# Patient Record
Sex: Female | Born: 1995 | Race: White | Hispanic: No | Marital: Single | Smoking: Never smoker
Health system: Southern US, Community
[De-identification: ages and names within clinical notes are randomized; demographics above are authoritative.]

## PROBLEM LIST (undated history)

## (undated) DIAGNOSIS — L709 Acne, unspecified: Secondary | ICD-10-CM

## (undated) HISTORY — DX: Acne, unspecified: L70.9

---
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
## 2015-04-09 ENCOUNTER — Ambulatory Visit: Payer: Self-pay | Admitting: Family Medicine

## 2015-04-13 ENCOUNTER — Encounter: Payer: Self-pay | Admitting: Family Medicine

## 2015-04-13 ENCOUNTER — Ambulatory Visit (INDEPENDENT_AMBULATORY_CARE_PROVIDER_SITE_OTHER): Payer: Self-pay | Admitting: Family Medicine

## 2015-04-13 VITALS — BP 111/63 | HR 52 | Temp 98.1°F

## 2015-04-13 DIAGNOSIS — L700 Acne vulgaris: Secondary | ICD-10-CM

## 2015-04-13 DIAGNOSIS — Z3009 Encounter for other general counseling and advice on contraception: Secondary | ICD-10-CM

## 2015-04-13 NOTE — Patient Instructions (Signed)
Contraception Choices Contraception (birth control) is the use of any methods or devices to prevent pregnancy. Below are some methods to help avoid pregnancy. HORMONAL METHODS   Contraceptive implant. This is a thin, plastic tube containing progesterone hormone. It does not contain estrogen hormone. Your health care provider inserts the tube in the inner part of the upper arm. The tube can remain in place for up to 3 years. After 3 years, the implant must be removed. The implant prevents the ovaries from releasing an egg (ovulation), thickens the cervical mucus to prevent sperm from entering the uterus, and thins the lining of the inside of the uterus.  Progesterone-only injections. These injections are given every 3 months by your health care provider to prevent pregnancy. This synthetic progesterone hormone stops the ovaries from releasing eggs. It also thickens cervical mucus and changes the uterine lining. This makes it harder for sperm to survive in the uterus.  Birth control pills. These pills contain estrogen and progesterone hormone. They work by preventing the ovaries from releasing eggs (ovulation). They also cause the cervical mucus to thicken, preventing the sperm from entering the uterus. Birth control pills are prescribed by a health care provider.Birth control pills can also be used to treat heavy periods.  Minipill. This type of birth control pill contains only the progesterone hormone. They are taken every day of each month and must be prescribed by your health care provider.  Birth control patch. The patch contains hormones similar to those in birth control pills. It must be changed once a week and is prescribed by a health care provider.  Vaginal ring. The ring contains hormones similar to those in birth control pills. It is left in the vagina for 3 weeks, removed for 1 week, and then a new one is put back in place. The patient must be comfortable inserting and removing the ring  from the vagina.A health care provider's prescription is necessary.  Emergency contraception. Emergency contraceptives prevent pregnancy after unprotected sexual intercourse. This pill can be taken right after sex or up to 5 days after unprotected sex. It is most effective the sooner you take the pills after having sexual intercourse. Most emergency contraceptive pills are available without a prescription. Check with your pharmacist. Do not use emergency contraception as your only form of birth control. BARRIER METHODS   Female condom. This is a thin sheath (latex or rubber) that is worn over the penis during sexual intercourse. It can be used with spermicide to increase effectiveness.  Female condom. This is a soft, loose-fitting sheath that is put into the vagina before sexual intercourse.  Diaphragm. This is a soft, latex, dome-shaped barrier that must be fitted by a health care provider. It is inserted into the vagina, along with a spermicidal jelly. It is inserted before intercourse. The diaphragm should be left in the vagina for 6 to 8 hours after intercourse.  Cervical cap. This is a round, soft, latex or plastic cup that fits over the cervix and must be fitted by a health care provider. The cap can be left in place for up to 48 hours after intercourse.  Sponge. This is a soft, circular piece of polyurethane foam. The sponge has spermicide in it. It is inserted into the vagina after wetting it and before sexual intercourse.  Spermicides. These are chemicals that kill or block sperm from entering the cervix and uterus. They come in the form of creams, jellies, suppositories, foam, or tablets. They do not require a   prescription. They are inserted into the vagina with an applicator before having sexual intercourse. The process must be repeated every time you have sexual intercourse. INTRAUTERINE CONTRACEPTION  Intrauterine device (IUD). This is a T-shaped device that is put in a woman's uterus  during a menstrual period to prevent pregnancy. There are 2 types:  Copper IUD. This type of IUD is wrapped in copper wire and is placed inside the uterus. Copper makes the uterus and fallopian tubes produce a fluid that kills sperm. It can stay in place for 10 years.  Hormone IUD. This type of IUD contains the hormone progestin (synthetic progesterone). The hormone thickens the cervical mucus and prevents sperm from entering the uterus, and it also thins the uterine lining to prevent implantation of a fertilized egg. The hormone can weaken or kill the sperm that get into the uterus. It can stay in place for 3-5 years, depending on which type of IUD is used. PERMANENT METHODS OF CONTRACEPTION  Female tubal ligation. This is when the woman's fallopian tubes are surgically sealed, tied, or blocked to prevent the egg from traveling to the uterus.  Hysteroscopic sterilization. This involves placing a small coil or insert into each fallopian tube. Your doctor uses a technique called hysteroscopy to do the procedure. The device causes scar tissue to form. This results in permanent blockage of the fallopian tubes, so the sperm cannot fertilize the egg. It takes about 3 months after the procedure for the tubes to become blocked. You must use another form of birth control for these 3 months.  Female sterilization. This is when the female has the tubes that carry sperm tied off (vasectomy).This blocks sperm from entering the vagina during sexual intercourse. After the procedure, the man can still ejaculate fluid (semen). NATURAL PLANNING METHODS  Natural family planning. This is not having sexual intercourse or using a barrier method (condom, diaphragm, cervical cap) on days the woman could become pregnant.  Calendar method. This is keeping track of the length of each menstrual cycle and identifying when you are fertile.  Ovulation method. This is avoiding sexual intercourse during ovulation.  Symptothermal  method. This is avoiding sexual intercourse during ovulation, using a thermometer and ovulation symptoms.  Post-ovulation method. This is timing sexual intercourse after you have ovulated. Regardless of which type or method of contraception you choose, it is important that you use condoms to protect against the transmission of sexually transmitted infections (STIs). Talk with your health care provider about which form of contraception is most appropriate for you. Document Released: 11/03/2005 Document Revised: 11/08/2013 Document Reviewed: 04/28/2013 ExitCare Patient Information 2015 [employer]. This information is not intended to replace advice given to you by your health care provider. Make sure you discuss any questions you have with your health care provider.  

## 2015-04-13 NOTE — Progress Notes (Signed)
Pre visit review using our clinic review tool, if applicable. No additional management support is needed unless otherwise documented below in the visit note. 

## 2015-04-13 NOTE — Progress Notes (Signed)
Patient ID: Patricia Espinoza, female    DOB: 01-23-1996  Age: 19 y.o. MRN: [id number]    Subjective:  Subjective HPI Patricia Espinoza presents to establish.  She has no complaints.    Review of Systems  Constitutional: Negative for activity change, appetite change, fatigue and unexpected weight change.  Respiratory: Negative for cough and shortness of breath.   Cardiovascular: Negative for chest pain and palpitations.  Psychiatric/Behavioral: Negative for behavioral problems and dysphoric mood. The patient is not nervous/anxious.     History Past Medical History  Diagnosis Date  . Acne     She has past surgical history that includes Knee surgery (Left, 2012); Knee surgery (Right, 2013); and Wisdom tooth extraction (2015).   Her family history includes Breast cancer in an other family member; Diabetes in her paternal grandfather; Hyperlipidemia in her father and another family member; Hypertension in her father and another family member.She reports that she has never smoked. She has never used smokeless tobacco. She reports that she does not drink alcohol or use illicit drugs.  No current outpatient prescriptions on file prior to visit.   No current facility-administered medications on file prior to visit.     Objective:  Objective Physical Exam  Constitutional: She is oriented to person, place, and time. She appears well-developed and well-nourished.  HENT:  Head: Normocephalic and atraumatic.  Eyes: Conjunctivae and EOM are normal.  Neck: Normal range of motion. Neck supple. No JVD present. Carotid bruit is not present. No thyromegaly present.  Cardiovascular: Normal rate, regular rhythm and normal heart sounds.   No murmur heard. Pulmonary/Chest: Effort normal and breath sounds normal. No respiratory distress. She has no wheezes. She has no rales. She exhibits no tenderness.  Musculoskeletal: She exhibits no edema.  Neurological: She is alert and oriented to person, place,  and time.  Psychiatric: She has a normal mood and affect. Her behavior is normal.   BP 111/63 mmHg  Pulse 52  Temp(Src) 98.1 F (36.7 C) (Oral)  SpO2 100%  LMP 03/14/2015 Wt Readings from Last 3 Encounters:  06/10/11 120 lb (54.432 kg) (58 %*, Z = 0.21)   * Growth percentiles are based on CDC 2-20 Years data.     No results found for: WBC, HGB, HCT, PLT, GLUCOSE, CHOL, TRIG, HDL, LDLDIRECT, LDLCALC, ALT, AST, NA, K, CL, CREATININE, BUN, CO2, TSH, PSA, INR, GLUF, HGBA1C, MICROALBUR  Mr Extrem Low Jt*r* W/o Cm  04/17/2010   Clinical Data: Medial right knee pain and limited range of motion since a fall on 04/14/2010   MRI OF THE RIGHT KNEE WITHOUT CONTRAST   Technique:  Multiplanar, multisequence MR imaging of the right knee was performed.  No intravenous contrast was administered.   Comparison: Radiographs dated 04/15/2010   Findings:  There is a contusion of the lateral aspect of the lateral femoral condyle deep to the lateral patellofemoral ligament with slight edema in the overlying subcutaneous soft tissues consistent with soft tissue contusion.   There is no other evidence to suggest a transient patellar dislocation. The distal quadriceps tendon and patellar tendon are normal.  The medial patellofemoral ligament is normal.   There is a small joint effusion.   The cruciate and collateral ligaments are normal.  Medial and lateral menisci are normal.   IMPRESSION:   1.  Focal bone contusion of the lateral aspect of the lateral femoral condyle deep to the lateral patellofemoral ligament with overlying soft tissue contusion.  I suspect that this is due  to a direct blow to that area. 2.  Small joint effusion.  Provider: Dorthy Cooler    Assessment & Plan:  Plan I am having Ms. Stribling maintain her SPRINTEC 28, EPIPEN 2-PAK, clindamycin, ampicillin, and adapalene.  Meds ordered this encounter  Medications  . SPRINTEC 28 0.25-35 MG-MCG tablet    Sig: Take 1 tablet by mouth daily.    Refill:  1    . EPIPEN 2-PAK 0.3 MG/0.3ML SOAJ injection    Sig: as directed.    Refill:  0  . clindamycin (CLINDAGEL) 1 % gel    Sig: Apply to the face in the morning    Refill:  11  . ampicillin (PRINCIPEN) 500 MG capsule    Sig: Take 500 mg by mouth 2 (two) times daily.    Refill:  11  . adapalene (DIFFERIN) 0.1 % cream    Sig: Apply a pea-size amount to the face at Bedtime    Refill:  11    Problem List Items Addressed This Visit    None    Visit Diagnoses    Encounter for other general counseling or advice on contraception    -  Primary    Acne vulgaris        Relevant Medications    SPRINTEC 28 0.25-35 MG-MCG tablet    clindamycin (CLINDAGEL) 1 % gel    adapalene (DIFFERIN) 0.1 % cream       Follow-up: Return in about 1 year (around 04/12/2016), or if symptoms worsen or fail to improve, for annual exam.  Loreen Freud, DO

## 2015-04-16 DIAGNOSIS — L700 Acne vulgaris: Secondary | ICD-10-CM | POA: Insufficient documentation

## 2015-12-31 ENCOUNTER — Other Ambulatory Visit: Payer: Self-pay

## 2015-12-31 MED ORDER — SPRINTEC 28 0.25-35 MG-MCG PO TABS: 1.0000 | ORAL_TABLET | Freq: Every day | ORAL | Status: DC

## 2016-02-18 DIAGNOSIS — H1031 Unspecified acute conjunctivitis, right eye: Secondary | ICD-10-CM | POA: Diagnosis not present

## 2016-04-07 DIAGNOSIS — L700 Acne vulgaris: Secondary | ICD-10-CM | POA: Diagnosis not present

## 2016-08-13 ENCOUNTER — Other Ambulatory Visit: Payer: Self-pay | Admitting: Family Medicine

## 2016-09-12 ENCOUNTER — Other Ambulatory Visit: Payer: Self-pay | Admitting: Family Medicine

## 2016-09-15 NOTE — Telephone Encounter (Signed)
30 day supply of birth control medication sent to pharmacy. Pt is past due for physical. Please call pt to arrange appointment with Dr Laury AxonLowne soon. Thanks!

## 2016-09-18 NOTE — Telephone Encounter (Signed)
Spoke with Mom and she will call to schedule when she has patients college schedule with her.

## 2016-10-13 ENCOUNTER — Other Ambulatory Visit: Payer: Self-pay | Admitting: Family Medicine

## 2016-10-14 ENCOUNTER — Telehealth: Payer: Self-pay

## 2016-10-14 NOTE — Telephone Encounter (Signed)
Called pt voicemail for pt to call the ofc. Please call pt to set up for physical exam she is over due thanks.  PC

## 2016-10-20 NOTE — Telephone Encounter (Signed)
Left message on VM @ [phone] for patient to call and schedule appointment

## 2016-10-22 NOTE — Telephone Encounter (Signed)
error 

## 2016-11-11 ENCOUNTER — Other Ambulatory Visit: Payer: Self-pay | Admitting: Family Medicine

## 2016-11-27 ENCOUNTER — Ambulatory Visit (INDEPENDENT_AMBULATORY_CARE_PROVIDER_SITE_OTHER): Payer: BLUE CROSS/BLUE SHIELD | Admitting: Family Medicine

## 2016-11-27 ENCOUNTER — Encounter: Payer: Self-pay | Admitting: Family Medicine

## 2016-11-27 VITALS — BP 116/80 | HR 70 | Temp 98.3°F | Resp 16 | Ht 66.0 in | Wt 138.2 lb

## 2016-11-27 DIAGNOSIS — Z0000 Encounter for general adult medical examination without abnormal findings: Secondary | ICD-10-CM

## 2016-11-27 DIAGNOSIS — Z3009 Encounter for other general counseling and advice on contraception: Secondary | ICD-10-CM

## 2016-11-27 MED ORDER — MONONESSA 0.25-35 MG-MCG PO TABS: 1.0000 | ORAL_TABLET | Freq: Every day | ORAL | 11 refills | Status: DC

## 2016-11-27 NOTE — Patient Instructions (Signed)
Preventive Care 18-39 Years, Female Preventive care refers to lifestyle choices and visits with your health care provider that can promote health and wellness. What does preventive care include?  A yearly physical exam. This is also called an annual well check.  Dental exams once or twice a year.  Routine eye exams. Ask your health care provider how often you should have your eyes checked.  Personal lifestyle choices, including:  Daily care of your teeth and gums.  Regular physical activity.  Eating a healthy diet.  Avoiding tobacco and drug use.  Limiting alcohol use.  Practicing safe sex.  Taking vitamin and mineral supplements as recommended by your health care provider. What happens during an annual well check? The services and screenings done by your health care provider during your annual well check will depend on your age, overall health, lifestyle risk factors, and family history of disease. Counseling  Your health care provider may ask you questions about your:  Alcohol use.  Tobacco use.  Drug use.  Emotional well-being.  Home and relationship well-being.  Sexual activity.  Eating habits.  Work and work environment.  Method of birth control.  Menstrual cycle.  Pregnancy history. Screening  You may have the following tests or measurements:  Height, weight, and BMI.  Diabetes screening. This is done by checking your blood sugar (glucose) after you have not eaten for a while (fasting).  Blood pressure.  Lipid and cholesterol levels. These may be checked every 5 years starting at age 20.  Skin check.  Hepatitis C blood test.  Hepatitis B blood test.  Sexually transmitted disease (STD) testing.  BRCA-related cancer screening. This may be done if you have a family history of breast, ovarian, tubal, or peritoneal cancers.  Pelvic exam and Pap test. This may be done every 3 years starting at age 21. Starting at age 30, this may be done every 5  years if you have a Pap test in combination with an HPV test. Discuss your test results, treatment options, and if necessary, the need for more tests with your health care provider. Vaccines  Your health care provider may recommend certain vaccines, such as:  Influenza vaccine. This is recommended every year.  Tetanus, diphtheria, and acellular pertussis (Tdap, Td) vaccine. You may need a Td booster every 10 years.  Varicella vaccine. You may need this if you have not been vaccinated.  HPV vaccine. If you are 26 or younger, you may need three doses over 6 months.  Measles, mumps, and rubella (MMR) vaccine. You may need at least one dose of MMR. You may also need a second dose.  Pneumococcal 13-valent conjugate (PCV13) vaccine. You may need this if you have certain conditions and were not previously vaccinated.  Pneumococcal polysaccharide (PPSV23) vaccine. You may need one or two doses if you smoke cigarettes or if you have certain conditions.  Meningococcal vaccine. One dose is recommended if you are age 19-21 years and a first-year college student living in a residence hall, or if you have one of several medical conditions. You may also need additional booster doses.  Hepatitis A vaccine. You may need this if you have certain conditions or if you travel or work in places where you may be exposed to hepatitis A.  Hepatitis B vaccine. You may need this if you have certain conditions or if you travel or work in places where you may be exposed to hepatitis B.  Haemophilus influenzae type b (Hib) vaccine. You may need this   if you have certain risk factors. Talk to your health care provider about which screenings and vaccines you need and how often you need them. This information is not intended to replace advice given to you by your health care provider. Make sure you discuss any questions you have with your health care provider. Document Released: 12/30/2001 Document Revised: 07/23/2016  Document Reviewed: 09/04/2015 Elsevier Interactive Patient Education  2017 [employer].

## 2016-11-27 NOTE — Progress Notes (Signed)
Pre visit review using our clinic review tool, if applicable. No additional management support is needed unless otherwise documented below in the visit note. 

## 2016-11-27 NOTE — Progress Notes (Signed)
Subjective:     Patricia Espinoza is a 21 y.o. female and is here for a comprehensive physical exam. The patient reports no problems.  Social History   Social History  . Marital status: Single    Spouse name: N/A  . Number of children: N/A  . Years of education: N/A   Occupational History  . student     Social History Main Topics  . Smoking status: Never Smoker  . Smokeless tobacco: Never Used  . Alcohol use Yes     Comment: rare  . Drug use: No  . Sexual activity: Yes    Partners: Male    Birth control/ protection: Pill, Condom   Other Topics Concern  . Not on file   Social History Narrative  . No narrative on file   Health Maintenance  Topic Date Due  . CHLAMYDIA SCREENING  04/03/2011  . HIV Screening  04/03/2011  . INFLUENZA VACCINE  02/14/2017 (Originally 06/17/2016)  . TETANUS/TDAP  02/01/2024    The following portions of the patient's history were reviewed and updated as appropriate: She  has a past medical history of Acne. She  does not have any pertinent problems on file. She  has a past surgical history that includes Knee surgery (Left, 2012); Knee surgery (Right, 2013); and Wisdom tooth extraction (2015). Her family history includes Diabetes in her paternal grandfather; Hyperlipidemia in her father; Hypertension in her father. She  reports that she has never smoked. She has never used smokeless tobacco. She reports that she drinks alcohol. She reports that she does not use drugs. She has a current medication list which includes the following prescription(s): adapalene, clindamycin, epipen 2-pak, and mononessa. Current Outpatient Prescriptions on File Prior to Visit  Medication Sig Dispense Refill  . adapalene (DIFFERIN) 0.1 % cream Apply a pea-size amount to the face at Bedtime  11  . clindamycin (CLINDAGEL) 1 % gel Apply to the face in the morning  11  . EPIPEN 2-PAK 0.3 MG/0.3ML SOAJ injection as directed.  0   No current facility-administered medications on  file prior to visit.    She is allergic to hydrocodone..  Review of Systems Review of Systems  Constitutional: Negative for activity change, appetite change and fatigue.  HENT: Negative for hearing loss, congestion, tinnitus and ear discharge.  dentist q32m Eyes: Negative for visual disturbance (see optho q1y -- vision corrected to 20/20 with glasses).  Respiratory: Negative for cough, chest tightness and shortness of breath.   Cardiovascular: Negative for chest pain, palpitations and leg swelling.  Gastrointestinal: Negative for abdominal pain, diarrhea, constipation and abdominal distention.  Genitourinary: Negative for urgency, frequency, decreased urine volume and difficulty urinating.  Musculoskeletal: Negative for back pain, arthralgias and gait problem.  Skin: Negative for color change, pallor and rash.  Neurological: Negative for dizziness, light-headedness, numbness and headaches.  Hematological: Negative for adenopathy. Does not bruise/bleed easily.  Psychiatric/Behavioral: Negative for suicidal ideas, confusion, sleep disturbance, self-injury, dysphoric mood, decreased concentration and agitation.       Objective:    BP 116/80 (BP Location: Right Arm, Cuff Size: Normal)   Pulse 70   Temp 98.3 F (36.8 C) (Oral)   Resp 16   Ht 5\' 6"  (1.676 m)   Wt 138 lb 3.2 oz (62.7 kg)   LMP 11/13/2016   SpO2 98%   BMI 22.31 kg/m  General appearance: alert, cooperative, appears stated age and no distress Head: Normocephalic, without obvious abnormality, atraumatic Eyes: conjunctivae/corneas clear. PERRL, EOM's intact.  Fundi benign. Ears: normal TM's and external ear canals both ears Nose: Nares normal. Septum midline. Mucosa normal. No drainage or sinus tenderness. Throat: lips, mucosa, and tongue normal; teeth and gums normal Neck: no adenopathy, no carotid bruit, no JVD, supple, symmetrical, trachea midline and thyroid not enlarged, symmetric, no tenderness/mass/nodules Back:  symmetric, no curvature. ROM normal. No CVA tenderness. Lungs: clear to auscultation bilaterally Breasts: normal appearance, no masses or tenderness Heart: regular rate and rhythm, S1, S2 normal, no murmur, click, rub or gallop Abdomen: soft, non-tender; bowel sounds normal; no masses,  no organomegaly Pelvic: deferred Extremities: extremities normal, atraumatic, no cyanosis or edema Pulses: 2+ and symmetric Skin: Skin color, texture, turgor normal. No rashes or lesions Lymph nodes: Cervical, supraclavicular, and axillary nodes normal. Neurologic: Alert and oriented X 3, normal strength and tone. Normal symmetric reflexes. Normal coordination and gait    Assessment:    Healthy female exam.      Plan:    ghm utd Check labs See After Visit Summary for Counseling Recommendations    1. Preventative health care See above  - Lipid panel - Comprehensive metabolic panel - TSH - POCT Urinalysis Dipstick (Automated)  2. Encounter for other general counseling or advice on contraception   - MONONESSA 0.25-35 MG-MCG tablet; Take 1 tablet by mouth daily.  Dispense: 1 Package; Refill: 11

## 2017-04-20 DIAGNOSIS — L700 Acne vulgaris: Secondary | ICD-10-CM | POA: Diagnosis not present

## 2017-05-05 DIAGNOSIS — L710 Perioral dermatitis: Secondary | ICD-10-CM | POA: Diagnosis not present

## 2017-09-09 ENCOUNTER — Telehealth: Payer: Self-pay | Admitting: Family Medicine

## 2017-09-09 NOTE — Telephone Encounter (Signed)
Mail patient imm records per patient needing it for school.

## 2017-12-09 ENCOUNTER — Other Ambulatory Visit: Payer: Self-pay | Admitting: Family Medicine

## 2017-12-09 DIAGNOSIS — Z3009 Encounter for other general counseling and advice on contraception: Secondary | ICD-10-CM

## 2018-01-07 ENCOUNTER — Other Ambulatory Visit: Payer: Self-pay | Admitting: Family Medicine

## 2018-01-07 DIAGNOSIS — Z3009 Encounter for other general counseling and advice on contraception: Secondary | ICD-10-CM

## 2018-01-11 ENCOUNTER — Telehealth: Payer: Self-pay | Admitting: *Deleted

## 2018-01-11 DIAGNOSIS — Z3009 Encounter for other general counseling and advice on contraception: Secondary | ICD-10-CM

## 2018-01-11 MED ORDER — NORGESTIMATE-ETH ESTRADIOL 0.25-35 MG-MCG PO TABS: 1.0000 | ORAL_TABLET | Freq: Every day | ORAL | 0 refills | Status: DC

## 2018-01-13 ENCOUNTER — Other Ambulatory Visit: Payer: Self-pay

## 2018-01-13 ENCOUNTER — Other Ambulatory Visit: Payer: Self-pay | Admitting: Family Medicine

## 2018-01-13 DIAGNOSIS — Z3009 Encounter for other general counseling and advice on contraception: Secondary | ICD-10-CM

## 2018-01-13 MED ORDER — NORGESTIMATE-ETH ESTRADIOL 0.25-35 MG-MCG PO TABS: 1.0000 | ORAL_TABLET | Freq: Every day | ORAL | 0 refills | Status: DC

## 2018-01-13 NOTE — Telephone Encounter (Signed)
Called [facility] [place] and cancelled the Rx for Norgestimate-ethyinyl estradiol, and sent Rx of same through to [facility] [place], [place], per pt. Request.  Called pt.; left voice message that the requested Rx was sent to the [facility] [place].

## 2018-01-13 NOTE — Telephone Encounter (Signed)
Refill was sent to the wrong pharmacy. Pt has called in and re-requested medication order to be sent to  [id number] - [place] - [address] [facility] [address] [phone] (Phone) [phone] (Fax)

## 2018-02-15 ENCOUNTER — Other Ambulatory Visit: Payer: Self-pay | Admitting: Family Medicine

## 2018-02-15 DIAGNOSIS — Z3009 Encounter for other general counseling and advice on contraception: Secondary | ICD-10-CM

## 2018-02-25 ENCOUNTER — Other Ambulatory Visit: Payer: Self-pay | Admitting: *Deleted

## 2018-02-25 DIAGNOSIS — Z3009 Encounter for other general counseling and advice on contraception: Secondary | ICD-10-CM

## 2018-02-25 MED ORDER — NORGESTIMATE-ETH ESTRADIOL 0.25-35 MG-MCG PO TABS: 1.0000 | ORAL_TABLET | Freq: Every day | ORAL | 1 refills | Status: DC

## 2018-06-04 DIAGNOSIS — L700 Acne vulgaris: Secondary | ICD-10-CM | POA: Diagnosis not present

## 2018-06-04 DIAGNOSIS — L2084 Intrinsic (allergic) eczema: Secondary | ICD-10-CM | POA: Diagnosis not present

## 2018-06-04 DIAGNOSIS — L710 Perioral dermatitis: Secondary | ICD-10-CM | POA: Diagnosis not present

## 2018-06-14 DIAGNOSIS — Z6823 Body mass index (BMI) 23.0-23.9, adult: Secondary | ICD-10-CM | POA: Diagnosis not present

## 2018-06-14 DIAGNOSIS — Z01419 Encounter for gynecological examination (general) (routine) without abnormal findings: Secondary | ICD-10-CM | POA: Diagnosis not present

## 2018-06-25 DIAGNOSIS — L259 Unspecified contact dermatitis, unspecified cause: Secondary | ICD-10-CM | POA: Diagnosis not present

## 2018-07-13 DIAGNOSIS — L259 Unspecified contact dermatitis, unspecified cause: Secondary | ICD-10-CM | POA: Diagnosis not present

## 2018-07-16 DIAGNOSIS — L259 Unspecified contact dermatitis, unspecified cause: Secondary | ICD-10-CM | POA: Diagnosis not present

## 2018-08-11 DIAGNOSIS — Z23 Encounter for immunization: Secondary | ICD-10-CM | POA: Diagnosis not present

## 2019-02-01 DIAGNOSIS — S83001A Unspecified subluxation of right patella, initial encounter: Secondary | ICD-10-CM | POA: Diagnosis not present

## 2019-02-03 DIAGNOSIS — M25561 Pain in right knee: Secondary | ICD-10-CM | POA: Diagnosis not present

## 2019-02-09 ENCOUNTER — Telehealth: Payer: Self-pay

## 2019-02-09 NOTE — Telephone Encounter (Signed)
Please advise 

## 2019-02-09 NOTE — Telephone Encounter (Signed)
Copied from CRM [id number]. Topic: General - Other >> Feb 09, 2019  1:01 PM Percival Spanish wrote:  Pt would like to know if its ok for her to have Meningococcal. 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

## 2019-02-09 NOTE — Telephone Encounter (Signed)
Pt stated that her insurance will cover both vaccines and would like to have them done. Please advise.

## 2019-02-09 NOTE — Telephone Encounter (Signed)
She can have men B  She may want to make sure her ins will pay for meng -- last one 2014 Unless the school is paying for it.---- I'm fine with her having both

## 2019-02-09 NOTE — Telephone Encounter (Signed)
Spoke w/ Pt- she will contact insurance first.

## 2019-02-10 NOTE — Telephone Encounter (Signed)
Please call to set patient up for nurse visit for Bexsero and Menveo.

## 2019-02-15 NOTE — Telephone Encounter (Signed)
Called patient and mother answered. Looking to schedule for 2 vaccines - put on nurse schedule at patients convenience. Patient will call back to schedule

## 2019-02-16 ENCOUNTER — Ambulatory Visit (INDEPENDENT_AMBULATORY_CARE_PROVIDER_SITE_OTHER): Payer: BLUE CROSS/BLUE SHIELD

## 2019-02-16 ENCOUNTER — Other Ambulatory Visit: Payer: Self-pay

## 2019-02-16 DIAGNOSIS — Z23 Encounter for immunization: Secondary | ICD-10-CM | POA: Diagnosis not present

## 2019-02-16 DIAGNOSIS — Z3009 Encounter for other general counseling and advice on contraception: Secondary | ICD-10-CM

## 2019-04-13 ENCOUNTER — Telehealth: Payer: Self-pay | Admitting: *Deleted

## 2019-04-13 ENCOUNTER — Encounter: Payer: Self-pay | Admitting: Family Medicine

## 2019-04-13 ENCOUNTER — Other Ambulatory Visit: Payer: Self-pay

## 2019-04-13 ENCOUNTER — Ambulatory Visit: Payer: BLUE CROSS/BLUE SHIELD | Admitting: Family Medicine

## 2019-04-13 NOTE — Telephone Encounter (Signed)
Did not get message till know will cancel appt.   Copied from CRM [id number]. Topic: General - Inquiry >> Apr 12, 2019  4:46 PM Deborha Payment wrote: Reason for CRM: Patient has doxy.me appt tomorrow 5/27 8AM. Patient would like to cancel appt. >> Apr 13, 2019  9:15 AM Angela Nevin wrote: Patient called stating that she had attempted to cancel appointment but still received call this morning. She inquired if she will be charged for no show appointment.

## 2019-07-13 DIAGNOSIS — Z01419 Encounter for gynecological examination (general) (routine) without abnormal findings: Secondary | ICD-10-CM | POA: Diagnosis not present

## 2019-07-13 DIAGNOSIS — Z6823 Body mass index (BMI) 23.0-23.9, adult: Secondary | ICD-10-CM | POA: Diagnosis not present

## 2020-04-10 ENCOUNTER — Encounter: Payer: BLUE CROSS/BLUE SHIELD | Admitting: Family Medicine

## 2020-04-12 ENCOUNTER — Encounter: Payer: Self-pay | Admitting: Family Medicine

## 2020-04-12 ENCOUNTER — Other Ambulatory Visit: Payer: Self-pay

## 2020-04-12 ENCOUNTER — Other Ambulatory Visit: Payer: Self-pay | Admitting: Medical

## 2020-04-12 ENCOUNTER — Ambulatory Visit (INDEPENDENT_AMBULATORY_CARE_PROVIDER_SITE_OTHER): Payer: BLUE CROSS/BLUE SHIELD | Admitting: Family Medicine

## 2020-04-12 VITALS — BP 110/80 | HR 84 | Temp 97.7°F | Resp 18 | Ht 66.0 in | Wt 140.6 lb

## 2020-04-12 DIAGNOSIS — Z0000 Encounter for general adult medical examination without abnormal findings: Secondary | ICD-10-CM | POA: Diagnosis not present

## 2020-04-12 NOTE — Progress Notes (Signed)
Subjective:     Patricia Espinoza is a 24 y.o. female and is here for a comprehensive physical exam. The patient reports no problems.  Social History   Socioeconomic History  . Marital status: Single    Spouse name: Not on file  . Number of children: Not on file  . Years of education: Not on file  . Highest education level: Not on file  Occupational History  . Occupation: Ship broker   Tobacco Use  . Smoking status: Never Smoker  . Smokeless tobacco: Never Used  Substance and Sexual Activity  . Alcohol use: Yes    Comment: rare  . Drug use: No  . Sexual activity: Yes    Partners: Male    Birth control/protection: Pill, Condom  Other Topics Concern  . Not on file  Social History Narrative  . Not on file   Social Determinants of Health   Financial Resource Strain:   . Difficulty of Paying Living Expenses:   Food Insecurity:   . Worried About Charity fundraiser in the Last Year:   . Arboriculturist in the Last Year:   Transportation Needs:   . Film/video editor (Medical):   Marland Kitchen Lack of Transportation (Non-Medical):   Physical Activity:   . Days of Exercise per Week:   . Minutes of Exercise per Session:   Stress:   . Feeling of Stress :   Social Connections:   . Frequency of Communication with Friends and Family:   . Frequency of Social Gatherings with Friends and Family:   . Attends Religious Services:   . Active Member of Clubs or Organizations:   . Attends Archivist Meetings:   Marland Kitchen Marital Status:   Intimate Partner Violence:   . Fear of Current or Ex-Partner:   . Emotionally Abused:   Marland Kitchen Physically Abused:   . Sexually Abused:    Health Maintenance  Topic Date Due  . CHLAMYDIA SCREENING  Never done  . HIV Screening  Never done  . PAP-Cervical Cytology Screening  Never done  . PAP SMEAR-Modifier  Never done  . INFLUENZA VACCINE  06/17/2020  . TETANUS/TDAP  02/01/2024    The following portions of the patient's history were reviewed and updated as  appropriate:  She  has a past medical history of Acne. She does not have any pertinent problems on file. She  has a past surgical history that includes Knee surgery (Left, 2012); Knee surgery (Right, 2013); and Wisdom tooth extraction (2015). Her family history includes Breast cancer in her unknown relative; Diabetes in her paternal grandfather; Hyperlipidemia in her father and unknown relative; Hypertension in her father and unknown relative. She  reports that she has never smoked. She has never used smokeless tobacco. She reports current alcohol use. She reports that she does not use drugs. She has a current medication list which includes the following prescription(s): adapalene, clindamycin, epipen 2-pak, and norgestimate-ethinyl estradiol. Current Outpatient Medications on File Prior to Visit  Medication Sig Dispense Refill  . adapalene (DIFFERIN) 0.1 % cream Apply a pea-size amount to the face at Bedtime  11  . clindamycin (CLINDAGEL) 1 % gel Apply to the face in the morning  11  . EPIPEN 2-PAK 0.3 MG/0.3ML SOAJ injection as directed.  0  . norgestimate-ethinyl estradiol (ESTARYLLA) 0.25-35 MG-MCG tablet Take 1 tablet by mouth daily. 84 tablet 1   No current facility-administered medications on file prior to visit.   She is allergic to hydrocodone.Marland Kitchen  Review of Systems Review of Systems  Constitutional: Negative for activity change, appetite change and fatigue.  HENT: Negative for hearing loss, congestion, tinnitus and ear discharge.  dentist q92m Eyes: Negative for visual disturbance (see optho q1y -- vision corrected to 20/20 with glasses).  Respiratory: Negative for cough, chest tightness and shortness of breath.   Cardiovascular: Negative for chest pain, palpitations and leg swelling.  Gastrointestinal: Negative for abdominal pain, diarrhea, constipation and abdominal distention.  Genitourinary: Negative for urgency, frequency, decreased urine volume and difficulty urinating.   Musculoskeletal: Negative for back pain, arthralgias and gait problem.  Skin: Negative for color change, pallor and rash.  Neurological: Negative for dizziness, light-headedness, numbness and headaches.  Hematological: Negative for adenopathy. Does not bruise/bleed easily.  Psychiatric/Behavioral: Negative for suicidal ideas, confusion, sleep disturbance, self-injury, dysphoric mood, decreased concentration and agitation.       Objective:    There were no vitals taken for this visit. General appearance: alert, cooperative, appears stated age and no distress Head: Normocephalic, without obvious abnormality, atraumatic Eyes: conjunctivae/corneas clear. PERRL, EOM's intact. Fundi benign. Ears: normal TM's and external ear canals both ears Neck: no adenopathy, no carotid bruit, no JVD, supple, symmetrical, trachea midline and thyroid not enlarged, symmetric, no tenderness/mass/nodules Back: symmetric, no curvature. ROM normal. No CVA tenderness. Lungs: clear to auscultation bilaterally Breasts:gyn  Heart: regular rate and rhythm, S1, S2 normal, no murmur, click, rub or gallop Abdomen: soft, non-tender; bowel sounds normal; no masses,  no organomegaly Pelvic: deferred--gyn Extremities: extremities normal, atraumatic, no cyanosis or edema Pulses: 2+ and symmetric Skin: Skin color, texture, turgor normal. No rashes or lesions Lymph nodes: Cervical, supraclavicular, and axillary nodes normal. Neurologic: Alert and oriented X 3, normal strength and tone. Normal symmetric reflexes. Normal coordination and gait    Assessment:    Healthy female exam.      Plan:     ghm utd Check labs  See After Visit Summary for Counseling Recommendations   Computer was down during her visit Labs were done upstairs at [facility]

## 2020-04-13 LAB — COMPREHENSIVE METABOLIC PANEL
ALT: 15 IU/L (ref 0–32)
AST: 20 IU/L (ref 0–40)
Albumin/Globulin Ratio: 1.5 (ref 1.2–2.2)
Albumin: 4.3 g/dL (ref 3.9–5.0)
Alkaline Phosphatase: 54 IU/L (ref 48–121)
BUN/Creatinine Ratio: 15 (ref 9–23)
BUN: 11 mg/dL (ref 6–20)
Bilirubin Total: 0.5 mg/dL (ref 0.0–1.2)
CO2: 23 mmol/L (ref 20–29)
Calcium: 10.1 mg/dL (ref 8.7–10.2)
Chloride: 102 mmol/L (ref 96–106)
Creatinine, Ser: 0.73 mg/dL (ref 0.57–1.00)
GFR calc Af Amer: 133 mL/min/{1.73_m2} (ref >59)
GFR calc non Af Amer: 116 mL/min/{1.73_m2} (ref >59)
Globulin, Total: 2.8 g/dL (ref 1.5–4.5)
Glucose: 82 mg/dL (ref 65–99)
Potassium: 4.6 mmol/L (ref 3.5–5.2)
Sodium: 139 mmol/L (ref 134–144)
Total Protein: 7.1 g/dL (ref 6.0–8.5)

## 2020-04-13 LAB — CBC WITH DIFFERENTIAL/PLATELET
Basophils Absolute: 0 10*3/uL (ref 0.0–0.2)
Basos: 0 %
EOS (ABSOLUTE): 0.1 10*3/uL (ref 0.0–0.4)
Eos: 1 %
Hematocrit: 39.9 % (ref 34.0–46.6)
Hemoglobin: 13.2 g/dL (ref 11.1–15.9)
Immature Grans (Abs): 0 10*3/uL (ref 0.0–0.1)
Immature Granulocytes: 0 %
Lymphocytes Absolute: 1.5 10*3/uL (ref 0.7–3.1)
Lymphs: 16 %
MCH: 30.4 pg (ref 26.6–33.0)
MCHC: 33.1 g/dL (ref 31.5–35.7)
MCV: 92 fL (ref 79–97)
Monocytes Absolute: 0.8 10*3/uL (ref 0.1–0.9)
Monocytes: 8 %
Neutrophils Absolute: 7.1 10*3/uL — ABNORMAL HIGH (ref 1.4–7.0)
Neutrophils: 75 %
Platelets: 435 10*3/uL (ref 150–450)
RBC: 4.34 x10E6/uL (ref 3.77–5.28)
RDW: 12.1 % (ref 11.7–15.4)
WBC: 9.6 10*3/uL (ref 3.4–10.8)

## 2020-04-13 LAB — SAR COV2 SEROLOGY (COVID19)AB(IGG),IA: DiaSorin SARS-CoV-2 Ab, IgG: NEGATIVE

## 2020-04-13 LAB — LIPID PANEL W/O CHOL/HDL RATIO
Cholesterol, Total: 174 mg/dL (ref 100–199)
HDL: 74 mg/dL (ref >39)
LDL Chol Calc (NIH): 71 mg/dL (ref 0–99)
Triglycerides: 176 mg/dL — ABNORMAL HIGH (ref 0–149)
VLDL Cholesterol Cal: 29 mg/dL (ref 5–40)

## 2020-07-16 DIAGNOSIS — Z01419 Encounter for gynecological examination (general) (routine) without abnormal findings: Secondary | ICD-10-CM | POA: Diagnosis not present

## 2020-07-16 DIAGNOSIS — Z113 Encounter for screening for infections with a predominantly sexual mode of transmission: Secondary | ICD-10-CM | POA: Diagnosis not present

## 2020-07-16 DIAGNOSIS — Z6822 Body mass index (BMI) 22.0-22.9, adult: Secondary | ICD-10-CM | POA: Diagnosis not present

## 2020-07-16 DIAGNOSIS — Z3040 Encounter for surveillance of contraceptives, unspecified: Secondary | ICD-10-CM | POA: Diagnosis not present

## 2020-12-25 ENCOUNTER — Telehealth: Payer: Self-pay | Admitting: Family Medicine

## 2020-12-25 NOTE — Telephone Encounter (Signed)
Records emailed ?

## 2020-12-25 NOTE — Telephone Encounter (Signed)
Patient is requesting immunization records be emailed to her at @ [email or web address]

## 2021-04-16 ENCOUNTER — Encounter: Payer: BLUE CROSS/BLUE SHIELD | Admitting: Family

## 2021-07-17 DIAGNOSIS — Z3040 Encounter for surveillance of contraceptives, unspecified: Secondary | ICD-10-CM | POA: Diagnosis not present

## 2021-07-17 DIAGNOSIS — Z01419 Encounter for gynecological examination (general) (routine) without abnormal findings: Secondary | ICD-10-CM | POA: Diagnosis not present

## 2021-07-17 DIAGNOSIS — Z6823 Body mass index (BMI) 23.0-23.9, adult: Secondary | ICD-10-CM | POA: Diagnosis not present

## 2021-11-05 ENCOUNTER — Encounter: Payer: BLUE CROSS/BLUE SHIELD | Admitting: Family Medicine

## 2021-11-08 ENCOUNTER — Ambulatory Visit (INDEPENDENT_AMBULATORY_CARE_PROVIDER_SITE_OTHER): Payer: BLUE CROSS/BLUE SHIELD | Admitting: Family Medicine

## 2021-11-08 ENCOUNTER — Encounter: Payer: Self-pay | Admitting: Family Medicine

## 2021-11-08 VITALS — BP 102/80 | HR 55 | Temp 97.5°F | Resp 16 | Ht 66.0 in | Wt 147.6 lb

## 2021-11-08 DIAGNOSIS — Z1159 Encounter for screening for other viral diseases: Secondary | ICD-10-CM | POA: Diagnosis not present

## 2021-11-08 DIAGNOSIS — Z111 Encounter for screening for respiratory tuberculosis: Secondary | ICD-10-CM | POA: Diagnosis not present

## 2021-11-08 DIAGNOSIS — Z021 Encounter for pre-employment examination: Secondary | ICD-10-CM | POA: Diagnosis not present

## 2021-11-08 DIAGNOSIS — Z0000 Encounter for general adult medical examination without abnormal findings: Secondary | ICD-10-CM | POA: Diagnosis not present

## 2021-11-08 LAB — CBC WITH DIFFERENTIAL/PLATELET
Basophils Absolute: 0 10*3/uL (ref 0.0–0.1)
Basophils Relative: 0.5 % (ref 0.0–3.0)
Eosinophils Absolute: 0.1 10*3/uL (ref 0.0–0.7)
Eosinophils Relative: 1.8 % (ref 0.0–5.0)
HCT: 37.3 % (ref 36.0–46.0)
Hemoglobin: 12.6 g/dL (ref 12.0–15.0)
Lymphocytes Relative: 38.2 % (ref 12.0–46.0)
Lymphs Abs: 1.6 10*3/uL (ref 0.7–4.0)
MCHC: 33.8 g/dL (ref 30.0–36.0)
MCV: 87.4 fl (ref 78.0–100.0)
Monocytes Absolute: 0.5 10*3/uL (ref 0.1–1.0)
Monocytes Relative: 10.6 % (ref 3.0–12.0)
Neutro Abs: 2.1 10*3/uL (ref 1.4–7.7)
Neutrophils Relative %: 48.9 % (ref 43.0–77.0)
Platelets: 375 10*3/uL (ref 150.0–400.0)
RBC: 4.27 Mil/uL (ref 3.87–5.11)
RDW: 13 % (ref 11.5–15.5)
WBC: 4.3 10*3/uL (ref 4.0–10.5)

## 2021-11-08 LAB — COMPREHENSIVE METABOLIC PANEL
ALT: 12 U/L (ref 0–35)
AST: 18 U/L (ref 0–37)
Albumin: 4.2 g/dL (ref 3.5–5.2)
Alkaline Phosphatase: 41 U/L (ref 39–117)
BUN: 11 mg/dL (ref 6–23)
CO2: 27 mEq/L (ref 19–32)
Calcium: 9.6 mg/dL (ref 8.4–10.5)
Chloride: 103 mEq/L (ref 96–112)
Creatinine, Ser: 0.7 mg/dL (ref 0.40–1.20)
GFR: 120.05 mL/min (ref >60.00)
Glucose, Bld: 75 mg/dL (ref 70–99)
Potassium: 4 mEq/L (ref 3.5–5.1)
Sodium: 138 mEq/L (ref 135–145)
Total Bilirubin: 0.6 mg/dL (ref 0.2–1.2)
Total Protein: 7.2 g/dL (ref 6.0–8.3)

## 2021-11-08 LAB — TSH: TSH: 1.96 u[IU]/mL (ref 0.35–5.50)

## 2021-11-08 LAB — LIPID PANEL
Cholesterol: 186 mg/dL (ref 0–200)
HDL: 74.6 mg/dL (ref >39.00)
LDL Cholesterol: 95 mg/dL (ref 0–99)
NonHDL: 111.71
Total CHOL/HDL Ratio: 2
Triglycerides: 84 mg/dL (ref 0.0–149.0)
VLDL: 16.8 mg/dL (ref 0.0–40.0)

## 2021-11-08 NOTE — Progress Notes (Signed)
Subjective:   By signing my name below, I, Patricia Espinoza, attest that this documentation has been prepared under the direction and in the presence of Patricia Schultz, DO. 11/08/2021   Patient ID: Patricia Espinoza, female    DOB: 1996/05/15, 25 y.o.   MRN: [id number]  Chief Complaint  Patient presents with   Annual Exam    Pt states fasting     HPI Patient is in today for a comprehensive physical exam. 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 A 10 panel drug screen, physical and a TB test.  She sees an OBGYN.   She recently saw a dermatologist for a skin condition she had on the left side of her abdomen.  She denies fever, hearing loss, ear pain,congestion, sinus pain, sore throat, eye pain, chest pain, palpitations, cough, shortness of breath, wheezing, nausea. vomiting, diarrhea, constipation, blood in stool, dysuria,frequency, hematuria and headaches.   There has been no changes in family medical history.   Past Medical History:  Diagnosis Date   Acne     Past Surgical History:  Procedure Laterality Date   KNEE SURGERY Left 2012   KNEE SURGERY Right 2013   WISDOM TOOTH EXTRACTION  2015    Family History  Problem Relation Age of Onset   Hyperlipidemia Father    Hypertension Father    Breast cancer Unknown    Hyperlipidemia Unknown    Hypertension Unknown    Diabetes Paternal Grandfather     Social History   Socioeconomic History   Marital status: Single    Spouse name: Not on file   Number of children: Not on file   Years of education: Not on file   Highest education level: Not on file  Occupational History   Occupation: student   Tobacco Use   Smoking status: Never   Smokeless tobacco: Never  Substance and Sexual Activity   Alcohol use: Yes    Comment: rare   Drug use: No   Sexual activity: Yes    Partners: Male    Birth control/protection: Pill, Condom  Other Topics Concern   Not on file  Social History  Narrative   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  0   norgestimate-ethinyl estradiol (ESTARYLLA) 0.25-35 MG-MCG tablet Take 1 tablet by mouth daily. 84 tablet 1   No facility-administered medications prior to visit.    Allergies  Allergen Reactions   Hydrocodone Hives    Review of Systems  Constitutional:  Negative for fever.  HENT:  Negative for congestion, ear pain, hearing loss, sinus pain and sore throat.   Eyes:  Negative for blurred vision and pain.  Respiratory:  Negative for cough, sputum production, shortness of breath and wheezing.   Cardiovascular:  Negative for chest pain and palpitations.  Gastrointestinal:  Negative for blood in stool,  constipation, diarrhea, nausea and vomiting.  Genitourinary:  Negative for dysuria, frequency, hematuria and urgency.  Musculoskeletal:  Negative for back pain, falls and myalgias.  Neurological:  Negative for dizziness, sensory change, loss of consciousness, weakness and headaches.  Endo/Heme/Allergies:  Negative for environmental allergies. Does not bruise/bleed easily.  Psychiatric/Behavioral:  Negative for depression and suicidal ideas. The patient is not nervous/anxious and does not have insomnia.       Objective:    Physical Exam Vitals and nursing note reviewed.  Constitutional:      General: She is not in acute distress.    Appearance: Normal appearance. She is not  ill-appearing.  HENT:     Head: Normocephalic and atraumatic.     Right Ear: Tympanic membrane, ear canal and external ear normal.     Left Ear: Tympanic membrane, ear canal and external ear normal.  Eyes:     Extraocular Movements: Extraocular movements intact.     Pupils: Pupils are equal, round, and reactive to light.  Cardiovascular:     Rate and Rhythm: Normal rate and regular rhythm.     Pulses: Normal pulses.     Heart sounds: Normal heart sounds. No murmur heard.   No gallop.  Pulmonary:     Effort: Pulmonary effort is normal. No respiratory distress.     Breath sounds: Normal breath sounds. No wheezing, rhonchi or rales.  Abdominal:     General: Bowel sounds are normal. There is no distension.     Palpations: Abdomen is soft. There is no mass.     Tenderness: There is no abdominal tenderness. There is no guarding or rebound.     Hernia: No hernia is present.  Musculoskeletal:     Cervical back: Normal range of motion and neck supple.  Lymphadenopathy:     Cervical: No cervical adenopathy.  Skin:    General: Skin is warm and dry.  Neurological:     Mental Status: She is alert and oriented to person, place, and time.  Psychiatric:        Behavior: Behavior normal.    BP 102/80 (BP Location: Left Arm, Patient Position: Sitting, Cuff Size: Normal)    Pulse (!) 55    Temp (!) 97.5 F (36.4 C) (Oral)    Resp 16    Ht 5\' 6"  (1.676 m)    Wt 147 lb 9.6 oz (67 kg)    LMP 11/05/2021    SpO2 99%    BMI 23.82 kg/m  Wt Readings from Last 3 Encounters:  11/08/21 147 lb 9.6 oz (67 kg)  04/12/20 140 lb 9.6 oz (63.8 kg)  11/27/16 138 lb 3.2 oz (62.7 kg)    Diabetic Foot Exam - Simple   No data filed    Lab Results  Component Value Date   WBC 9.6 04/12/2020   HGB 13.2 04/12/2020   HCT 39.9 04/12/2020   PLT 435 04/12/2020   GLUCOSE 82 04/12/2020   CHOL 174 04/12/2020   TRIG 176 (H) 04/12/2020   HDL 74 04/12/2020   LDLCALC 71 04/12/2020   ALT 15 04/12/2020   AST 20  04/12/2020   NA 139 04/12/2020   K 4.6 04/12/2020   CL 102 04/12/2020   CREATININE 0.73 04/12/2020   BUN 11 04/12/2020   CO2 23 04/12/2020    No results found for: TSH Lab Results  Component Value Date   WBC 9.6 04/12/2020   HGB 13.2 04/12/2020   HCT 39.9 04/12/2020   MCV 92 04/12/2020  PLT 435 04/12/2020   Lab Results  Component Value Date   NA 139 04/12/2020   K 4.6 04/12/2020   CO2 23 04/12/2020   GLUCOSE 82 04/12/2020   BUN 11 04/12/2020   CREATININE 0.73 04/12/2020   BILITOT 0.5 04/12/2020   ALKPHOS 54 04/12/2020   AST 20 04/12/2020   ALT 15 04/12/2020   PROT 7.1 04/12/2020   ALBUMIN 4.3 04/12/2020   CALCIUM 10.1 04/12/2020   Lab Results  Component Value Date   CHOL 174 04/12/2020   Lab Results  Component Value Date   HDL 74 04/12/2020   Lab Results  Component Value Date   LDLCALC 71 04/12/2020   Lab Results  Component Value Date   TRIG 176 (H) 04/12/2020   No results found for: CHOLHDL No results found for: HGBA1C       Assessment & Plan:   Problem List Items Addressed This Visit       Unprioritized   Preventative health care - Primary    ghm utd Check labs Tb gold will be done See avs  Pt needs uds for work as well      Relevant Orders   QuantiFERON-TB Gold Plus   CBC with Differential/Platelet   Comprehensive metabolic panel   Lipid panel   TSH   Other Visit Diagnoses     Screening-pulmonary TB       Relevant Orders   QuantiFERON-TB Gold Plus   Need for hepatitis C screening test       Relevant Orders   Hepatitis C antibody   Drug screening, pre-employment       Relevant Orders   Drug Screen 10 W/Conf, Se        No orders of the defined types were placed in this encounter.   I,Patricia Espinoza,acting as a Education administrator for [employer], DO.,have documented all relevant documentation on the behalf of Ann Held, DO,as directed by  Ann Held, DO while in the presence of Ann Held, [place], DO., personally preformed the services described in this documentation.  All medical record entries made by the scribe were at my direction and in my presence.  I have reviewed the chart and discharge instructions (if applicable) and agree that the record reflects my personal performance and is accurate and complete. 11/08/2021

## 2021-11-08 NOTE — Assessment & Plan Note (Signed)
ghm utd Check labs Tb gold will be done See avs  Pt needs uds for work as well

## 2021-11-08 NOTE — Patient Instructions (Signed)
Preventive Care 21-25 Years Old, Female °Preventive care refers to lifestyle choices and visits with your health care provider that can promote health and wellness. Preventive care visits are also called wellness exams. °What can I expect for my preventive care visit? °Counseling °During your preventive care visit, your health care provider may ask about your: °Medical history, including: °Past medical problems. °Family medical history. °Pregnancy history. °Current health, including: °Menstrual cycle. °Method of birth control. °Emotional well-being. °Home life and relationship well-being. °Sexual activity and sexual health. °Lifestyle, including: °Alcohol, nicotine or tobacco, and drug use. °Access to firearms. °Diet, exercise, and sleep habits. °Work and work environment. °Sunscreen use. °Safety issues such as seatbelt and bike helmet use. °Physical exam °Your health care provider may check your: °Height and weight. These may be used to calculate your BMI (body mass index). BMI is a measurement that tells if you are at a healthy weight. °Waist circumference. This measures the distance around your waistline. This measurement also tells if you are at a healthy weight and may help predict your risk of certain diseases, such as type 2 diabetes and high blood pressure. °Heart rate and blood pressure. °Body temperature. °Skin for abnormal spots. °What immunizations do I need? °Vaccines are usually given at various ages, according to a schedule. Your health care provider will recommend vaccines for you based on your age, medical history, and lifestyle or other factors, such as travel or where you work. °What tests do I need? °Screening °Your health care provider may recommend screening tests for certain conditions. This may include: °Pelvic exam and Pap test. °Lipid and cholesterol levels. °Diabetes screening. This is done by checking your blood sugar (glucose) after you have not eaten for a while (fasting). °Hepatitis B  test. °Hepatitis C test. °HIV (human immunodeficiency virus) test. °STI (sexually transmitted infection) testing, if you are at risk. °BRCA-related cancer screening. This may be done if you have a family history of breast, ovarian, tubal, or peritoneal cancers. °Talk with your health care provider about your test results, treatment options, and if necessary, the need for more tests. °Follow these instructions at home: °Eating and drinking ° °Eat a healthy diet that includes fresh fruits and vegetables, whole grains, lean protein, and low-fat dairy products. °Take vitamin and mineral supplements as recommended by your health care provider. °Do not drink alcohol if: °Your health care provider tells you not to drink. °You are pregnant, may be pregnant, or are planning to become pregnant. °If you drink alcohol: °Limit how much you have to 0-1 drink a day. °Know how much alcohol is in your drink. In the [place] one drink equals one 12 oz bottle of beer (355 mL), one 5 oz glass of wine (148 mL), or one 1½ oz glass of hard liquor (44 mL). °Lifestyle °Brush your teeth every morning and night with fluoride toothpaste. Floss one time each day. °Exercise for at least 30 minutes 5 or more days each week. °Do not use any products that contain nicotine or tobacco. These products include cigarettes, chewing tobacco, and vaping devices, such as e-cigarettes. If you need help quitting, ask your health care provider. °Do not use drugs. °If you are sexually active, practice safe sex. Use a condom or other form of protection to prevent STIs. °If you do not wish to become pregnant, use a form of birth control. If you plan to become pregnant, see your health care provider for a prepregnancy visit. °Find healthy ways to manage stress, such as: °Meditation, yoga,   or listening to music. °Journaling. °Talking to a trusted person. °Spending time with friends and family. °Minimize exposure to UV radiation to reduce your risk of skin  cancer. °Safety °Always wear your seat belt while driving or riding in a vehicle. °Do not drive: °If you have been drinking alcohol. Do not ride with someone who has been drinking. °If you have been using any mind-altering substances or drugs. °While texting. °When you are tired or distracted. °Wear a helmet and other protective equipment during sports activities. °If you have firearms in your house, make sure you follow all gun safety procedures. °Seek help if you have been physically or sexually abused. °What's next? °Go to your health care provider once a year for an annual wellness visit. °Ask your health care provider how often you should have your eyes and teeth checked. °Stay up to date on all vaccines. °This information is not intended to replace advice given to you by your health care provider. Make sure you discuss any questions you have with your health care provider. °Document Revised: 05/01/2021 Document Reviewed: 05/01/2021 °Elsevier Patient Education © 2022 [employer] Inc. ° °

## 2021-11-12 ENCOUNTER — Telehealth: Payer: Self-pay | Admitting: *Deleted

## 2021-11-12 LAB — TIQ-MISC

## 2021-11-12 NOTE — Addendum Note (Signed)
Addended by: Mervin Kung A on: 11/12/2021 08:35 AM   Modules accepted: Orders

## 2021-11-12 NOTE — Telephone Encounter (Signed)
Pt had lab orders from 11/08/21.  We received an inquiry from [facility] regarding drug screen 10, serum that was ordered. That test is no longer active in [facility]. We also submitted a urine for the drug screen so I gave them the test code for MCE0223 for urine and includes: benzos, cocaine, opiates, oxycodone, barbituates, tramadol, amphetamines, alcohol and marijuana. Is this ok or do you have other testing that is needed on this patient?

## 2021-11-14 LAB — QUANTIFERON-TB GOLD PLUS
Mitogen-NIL: >10 IU/mL
NIL: 0.05 IU/mL
QuantiFERON-TB Gold Plus: NEGATIVE
TB1-NIL: <0 IU/mL
TB2-NIL: <0 IU/mL

## 2021-11-14 LAB — DRUG MONITORING PANEL 375977 , URINE

## 2021-11-14 LAB — HEPATITIS C ANTIBODY
Hepatitis C Ab: NONREACTIVE
SIGNAL TO CUT-OFF: 0.03 (ref <1.00)

## 2021-11-14 LAB — DM TEMPLATE

## 2021-11-14 LAB — TEST AUTHORIZATION

## 2021-11-15 NOTE — Telephone Encounter (Signed)
Do you think the urine drug screen that was done will be sufficient for her employer?  The 10 panel blood test that was ordered is no longer available.

## 2021-11-20 NOTE — Telephone Encounter (Signed)
Left message on machine to call back  

## 2021-11-21 NOTE — Telephone Encounter (Signed)
Pt returned phone call and would like for someone to contact her around noon.

## 2021-11-21 NOTE — Telephone Encounter (Signed)
Clarified with the patient results were all normal. She had received a call from our office and thought was regarding her results/clarified had to do with test order not available/was it ok with employer what was done. Results were sent to employer and are ok as fair as she knows. Patient appreciated clarification of message.

## 2021-12-23 ENCOUNTER — Telehealth (INDEPENDENT_AMBULATORY_CARE_PROVIDER_SITE_OTHER): Payer: BLUE CROSS/BLUE SHIELD | Admitting: Family Medicine

## 2021-12-23 ENCOUNTER — Encounter: Payer: Self-pay | Admitting: Family Medicine

## 2021-12-23 VITALS — Temp 99.0°F

## 2021-12-23 DIAGNOSIS — R6889 Other general symptoms and signs: Secondary | ICD-10-CM | POA: Diagnosis not present

## 2021-12-23 MED ORDER — OSELTAMIVIR PHOSPHATE 75 MG PO CAPS: 75.0000 mg | ORAL_CAPSULE | Freq: Two times a day (BID) | ORAL | 0 refills | Status: DC

## 2021-12-23 NOTE — Progress Notes (Signed)
Virtual Video Visit via MyChart Note  I connected with  Patricia Espinoza on 12/23/21 at 11:20 AM EST by the video enabled telemedicine application for MyChart, and verified that I am speaking with the correct person using two identifiers.   I introduced myself as a Designer, jewellery with the practice. We discussed the limitations of evaluation and management by telemedicine and the availability of in person appointments. The patient expressed understanding and agreed to proceed.  Participating parties in this visit include: The patient and the nurse practitioner listed.  The patient is: At home I am: In the office - [facility]  Subjective:    CC: flu-like symptoms    HPI: Patricia Espinoza is a 26 y.o. year old female presenting today via [place] today for flu-like symptoms.  Patient states that Saturday morning (2 days ago) she woke up with rhinorrhea, fever, chills, fatigue, nausea, decreased appetite, cough. By Sunday fever was up to 102.3 and she felt completely wiped out. She took a negative COVID test. Temp today is 99 with tylenol and ibuprofen helping. 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    Past medical history, Surgical history, Family history not pertinant except as noted below, Social history, Allergies, and medications have been entered into the medical record, reviewed, and corrections made.   Review of Systems:  All review of systems negative except what is listed in the HPI   Objective:    General:  Speaking clearly in complete sentences. Absent shortness of breath noted.   Alert and oriented x3.   Normal judgment.  Absent acute distress.   Impression and Recommendations:    1. Flu-like symptoms - oseltamivir (TAMIFLU) 75 MG capsule; Take 1 capsule (75 mg total) by mouth 2 (two) times daily.  Dispense: 10 capsule; Refill: 0 Very suspicious of flu. She would like to try Tamiflu.  Continue supportive measures including rest, hydration, humidifier use, steam showers, warm compresses to sinuses, warm liquids with lemon and honey, and over-the-counter cough, cold, and analgesics as needed.  AVS information added and work note provided. Return to work when feeling better and when fever free for 24-hours without medication. Patient aware of signs/symptoms requiring further/urgent evaluation.     Follow-up if symptoms worsen or fail to improve.    I discussed the assessment and treatment plan with the patient. The patient was provided an opportunity to ask questions and all were answered. The patient agreed with the plan and demonstrated an understanding of the instructions.   The patient was advised to call back or seek an in-person evaluation if the symptoms worsen or if the condition fails to improve as anticipated.  I spent 20 minutes dedicated to the care of this patient on the date of this encounter to include pre-visit chart review of prior notes and results, face-to-face time with the patient, and post-visit ordering of testing as indicated.   Terrilyn Saver, NP

## 2021-12-23 NOTE — Patient Instructions (Signed)
Over the counter medications that may be helpful for symptoms:  Guaifenesin 1200 mg extended release tabs twice daily, with plenty of water For cough and congestion Brand name: Mucinex   Pseudoephedrine 30 mg, one or two tabs every 4 to 6 hours For sinus congestion Brand name: Sudafed You must get this from the pharmacy counter.  Oxymetazoline nasal spray each morning, one spray in each nostril, for NO MORE THAN 3 days  For nasal and sinus congestion Brand name: Afrin Saline nasal spray or Saline Nasal Irrigation 3-5 times a day For nasal and sinus congestion Brand names: Ocean or AYR Fluticasone nasal spray, one spray in each nostril, each morning (after oxymetazoline and saline, if used) For nasal and sinus congestion Brand name: Flonase Warm salt water gargles  For sore throat Every few hours as needed Alternate ibuprofen 400-600 mg and acetaminophen 1000 mg every 4-6 hours For fever, body aches, headache Brand names: Motrin or Advil and Tylenol Dextromethorphan 12-hour cough version 30 mg every 12 hours  For cough Brand name: Delsym Stop all other cold medications for now (Nyquil, Dayquil, Tylenol Cold, Theraflu, etc) and other non-prescription cough/cold preparations. Many of these have the same ingredients listed above and could cause an overdose of medication.   Herbal treatments that have been shown to be helpful in some patients include: Vitamin C 1000mg per day Vitamin D 4000iU per day Zinc 100mg per day Quercetin 25-500mg twice a day Melatonin 5-10mg at bedtime  General Instructions Allow your body to rest Drink PLENTY of fluids Isolate yourself from everyone, even family, until test results have returned   Once you have recovered, please continue good preventive care measures, including:  wearing a mask when in public wash your hands frequently avoid touching your face/nose/eyes cover coughs/sneezes with the inside of your elbow stay out of crowds keep a 6  foot distance from others  If you develop severe shortness of breath, uncontrolled fevers, coughing up blood, confusion, chest pain, or signs of dehydration (such as significantly decreased urine amounts or dizziness with standing) please go to the ER.  

## 2022-07-04 ENCOUNTER — Telehealth (INDEPENDENT_AMBULATORY_CARE_PROVIDER_SITE_OTHER): Payer: No Typology Code available for payment source | Admitting: Family Medicine

## 2022-07-04 ENCOUNTER — Encounter: Payer: Self-pay | Admitting: Family Medicine

## 2022-07-04 DIAGNOSIS — F419 Anxiety disorder, unspecified: Secondary | ICD-10-CM | POA: Diagnosis not present

## 2022-07-04 NOTE — Assessment & Plan Note (Signed)
Letter written for dog to be emotional support companion

## 2022-07-04 NOTE — Progress Notes (Signed)
MyChart Video Visit    Virtual Visit via Video Note   This visit type was conducted due to national recommendations for restrictions regarding the COVID-19 Pandemic (e.g. social distancing) in an effort to limit this patient's exposure and mitigate transmission in our community. This patient is at least at moderate risk for complications without adequate follow up. This format is felt to be most appropriate for this patient at this time Physical exam was limited by quality of the video and audio technology used for the visit. Patricia Espinoza  was able to get the patient set up on a video visit.  Patient location: home  Patient and provider in visit Provider location: Office  I discussed the limitations of evaluation and management by telemedicine and the availability of in person appointments. The patient expressed understanding and agreed to proceed.  Visit Date: 07/04/2022  Today's healthcare provider: [name], DO     Subjective:    Patient ID: Patricia Espinoza, female    DOB: Apr 29, 1996, 26 y.o.   MRN: [id number]  Chief Complaint  Patient presents with   Anxiety    HPI Patient is in today for discuss her anxiety.  She would like a noted that her dog is an emotional companion.  Her anxiety is worse when she is not with her.   Her vet advised she take to [place].    Past Medical History:  Diagnosis Date   Acne     Past Surgical History:  Procedure Laterality Date   KNEE SURGERY Left 2012   KNEE SURGERY Right 2013   WISDOM TOOTH EXTRACTION  2015    Family History  Problem Relation Age of Onset   Hyperlipidemia Father    Hypertension Father    Breast cancer Unknown    Hyperlipidemia Unknown    Hypertension Unknown    Diabetes Paternal Grandfather     Social History   Socioeconomic History   Marital status: Single    Spouse name: Not on file   Number of children: Not on file   Years of education: Not on file   Highest education level: Not on file   Occupational History   Occupation: student   Tobacco Use   Smoking status: Never   Smokeless tobacco: Never  Substance and Sexual Activity   Alcohol use: Yes    Comment: rare   Drug use: No   Sexual activity: Yes    Partners: Male    Birth control/protection: Pill, Condom  Other Topics Concern   Not on file  Social History Narrative   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  0   norgestimate-ethinyl estradiol (ESTARYLLA) 0.25-35 MG-MCG tablet Take 1 tablet by mouth daily. 84 tablet 1   oseltamivir (TAMIFLU) 75 MG capsule Take 1 capsule (75 mg total) by mouth 2 (two) times daily. 10 capsule 0   No  facility-administered medications prior to visit.    Allergies  Allergen Reactions   Hydrocodone Hives    Review of Systems  Constitutional:  Negative for fever and malaise/fatigue.  HENT:  Negative for congestion.   Eyes:  Negative for blurred vision.  Respiratory:  Negative for shortness of breath.   Cardiovascular:  Negative for chest pain, palpitations and leg swelling.  Gastrointestinal:  Negative for abdominal pain, blood in stool and nausea.  Genitourinary:  Negative for dysuria and frequency.  Musculoskeletal:  Negative for falls.  Skin:  Negative for rash.  Neurological:  Negative for dizziness, loss of consciousness and headaches.  Endo/Heme/Allergies:  Negative for environmental allergies.   Psychiatric/Behavioral:  Negative for depression. The patient is nervous/anxious.        Objective:    Physical Exam Vitals and nursing note reviewed.  Pulmonary:     Effort: Pulmonary effort is normal.  Psychiatric:        Mood and Affect: Mood normal.        Behavior: Behavior normal.     There were no vitals taken for this visit. Wt Readings from Last 3 Encounters:  11/08/21 147 lb 9.6 oz (67 kg)  04/12/20 140 lb 9.6 oz (63.8 kg)  11/27/16 138 lb 3.2 oz (62.7 kg)    Diabetic Foot Exam - Simple   No data filed    Lab Results  Component Value Date   WBC 4.3 11/08/2021   HGB 12.6 11/08/2021   HCT 37.3 11/08/2021   PLT 375.0 11/08/2021   GLUCOSE 75 11/08/2021   CHOL 186 11/08/2021   TRIG 84.0 11/08/2021   HDL 74.60 11/08/2021   LDLCALC 95 11/08/2021   ALT 12 11/08/2021   AST 18 11/08/2021   NA 138 11/08/2021   K 4.0 11/08/2021   CL 103 11/08/2021   CREATININE 0.70 11/08/2021   BUN 11 11/08/2021   CO2 27 11/08/2021   TSH 1.96 11/08/2021    Lab Results  Component Value Date   TSH 1.96 11/08/2021   Lab Results  Component Value Date   WBC 4.3 11/08/2021   HGB 12.6 11/08/2021   HCT 37.3 11/08/2021   MCV 87.4 11/08/2021   PLT 375.0 11/08/2021   Lab Results  Component Value Date   NA 138 11/08/2021   K 4.0 11/08/2021   CO2 27 11/08/2021   GLUCOSE 75 11/08/2021   BUN 11 11/08/2021   CREATININE 0.70 11/08/2021   BILITOT 0.6 11/08/2021   ALKPHOS 41 11/08/2021   AST 18 11/08/2021   ALT 12 11/08/2021   PROT 7.2 11/08/2021   ALBUMIN 4.2 11/08/2021   CALCIUM 9.6 11/08/2021   GFR 120.05 11/08/2021   Lab Results  Component Value Date   CHOL 186 11/08/2021   Lab Results  Component Value Date   HDL 74.60 11/08/2021   Lab Results  Component Value Date   LDLCALC 95 11/08/2021   Lab Results  Component Value Date   TRIG 84.0 11/08/2021   Lab Results  Component Value Date   CHOLHDL 2 11/08/2021   No results found for: "HGBA1C"      Assessment & Plan:   Problem List Items Addressed This Visit       Unprioritized   Anxiety - Primary    Letter written for dog to be emotional support companion       I have discontinued Danean B. Standley's oseltamivir. I am also having her maintain her EpiPen 2-Pak, clindamycin, adapalene, and norgestimate-ethinyl estradiol.  No orders of  the defined types were placed in this encounter.   I discussed the assessment and treatment plan with the patient. The patient was provided an opportunity to ask questions and all were answered. The patient agreed with the plan and demonstrated an understanding of the instructions.   The patient was advised to call back or seek an in-person evaluation if the symptoms worsen or if the condition fails to improve as anticipated.    [name], DO [facility] at [facility] [phone] (phone) [phone] (fax)  [facility]

## 2022-12-11 ENCOUNTER — Encounter: Payer: Self-pay | Admitting: Family Medicine

## 2022-12-11 ENCOUNTER — Ambulatory Visit: Payer: No Typology Code available for payment source | Admitting: Family Medicine

## 2022-12-11 VITALS — BP 98/68 | HR 55 | Temp 97.8°F | Resp 18 | Ht 66.0 in | Wt 149.2 lb

## 2022-12-11 DIAGNOSIS — Z0000 Encounter for general adult medical examination without abnormal findings: Secondary | ICD-10-CM

## 2022-12-11 LAB — CBC WITH DIFFERENTIAL/PLATELET
Basophils Absolute: 0 10*3/uL (ref 0.0–0.1)
Basophils Relative: 0.5 % (ref 0.0–3.0)
Eosinophils Absolute: 0.2 10*3/uL (ref 0.0–0.7)
Eosinophils Relative: 2.8 % (ref 0.0–5.0)
HCT: 40 % (ref 36.0–46.0)
Hemoglobin: 13.5 g/dL (ref 12.0–15.0)
Lymphocytes Relative: 36.5 % (ref 12.0–46.0)
Lymphs Abs: 2.5 10*3/uL (ref 0.7–4.0)
MCHC: 33.8 g/dL (ref 30.0–36.0)
MCV: 90 fl (ref 78.0–100.0)
Monocytes Absolute: 0.7 10*3/uL (ref 0.1–1.0)
Monocytes Relative: 9.6 % (ref 3.0–12.0)
Neutro Abs: 3.5 10*3/uL (ref 1.4–7.7)
Neutrophils Relative %: 50.6 % (ref 43.0–77.0)
Platelets: 411 10*3/uL — ABNORMAL HIGH (ref 150.0–400.0)
RBC: 4.45 Mil/uL (ref 3.87–5.11)
RDW: 12.3 % (ref 11.5–15.5)
WBC: 6.9 10*3/uL (ref 4.0–10.5)

## 2022-12-11 LAB — COMPREHENSIVE METABOLIC PANEL
ALT: 12 U/L (ref 0–35)
AST: 15 U/L (ref 0–37)
Albumin: 4.4 g/dL (ref 3.5–5.2)
Alkaline Phosphatase: 41 U/L (ref 39–117)
BUN: 12 mg/dL (ref 6–23)
CO2: 28 mEq/L (ref 19–32)
Calcium: 9.7 mg/dL (ref 8.4–10.5)
Chloride: 103 mEq/L (ref 96–112)
Creatinine, Ser: 0.68 mg/dL (ref 0.40–1.20)
GFR: 119.97 mL/min (ref >60.00)
Glucose, Bld: 82 mg/dL (ref 70–99)
Potassium: 4 mEq/L (ref 3.5–5.1)
Sodium: 138 mEq/L (ref 135–145)
Total Bilirubin: 0.4 mg/dL (ref 0.2–1.2)
Total Protein: 7.2 g/dL (ref 6.0–8.3)

## 2022-12-11 LAB — LIPID PANEL
Cholesterol: 157 mg/dL (ref 0–200)
HDL: 71 mg/dL (ref >39.00)
LDL Cholesterol: 69 mg/dL (ref 0–99)
NonHDL: 85.97
Total CHOL/HDL Ratio: 2
Triglycerides: 84 mg/dL (ref 0.0–149.0)
VLDL: 16.8 mg/dL (ref 0.0–40.0)

## 2022-12-11 LAB — TSH: TSH: 2.97 u[IU]/mL (ref 0.35–5.50)

## 2022-12-11 NOTE — Progress Notes (Signed)
Subjective:   By signing my name below, I, Shehryar Baig, attest that this documentation has been prepared under the direction and in the presence of Ann Held, DO. 12/11/2022   Patient ID: Patricia Espinoza, female    DOB: [birth year], 27 y.o.   MRN: [id number]  Chief Complaint  Patient presents with   Annual Exam    Pt states fasting     HPI Patient is in today for a comprehensive physical exam.   She denies having any fever, new moles, congestion, sore throat, sinus pain, new muscle pain, new joint pain, chest pain, palpations, cough, SOB, wheezing, n/v/d, constipation, dysuria, frequency, hematuria, or headaches at this time.   Past Medical History:  Diagnosis Date   Acne     Past Surgical History:  Procedure Laterality Date   KNEE SURGERY Left 2012   KNEE SURGERY Right 2013   WISDOM TOOTH EXTRACTION  2015    Family History  Problem Relation Age of Onset   Hyperlipidemia Father    Hypertension Father    Breast cancer Unknown    Hyperlipidemia Unknown    Hypertension Unknown    Diabetes Paternal Grandfather     Social History   Socioeconomic History   Marital status: Single    Spouse name: Not on file   Number of children: Not on file   Years of education: Not on file   Highest education level: Not on file  Occupational History   Occupation: student   Tobacco Use   Smoking status: Never   Smokeless tobacco: Never  Substance and Sexual Activity   Alcohol use: Yes    Comment: rare   Drug use: No   Sexual activity: Yes    Partners: Male    Birth control/protection: Pill, Condom  Other Topics Concern   Not on file  Social History Narrative   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 (Patient not taking: Reported on 12/11/2022)  0   norgestimate-ethinyl estradiol (ESTARYLLA) 0.25-35 MG-MCG tablet Take 1 tablet by mouth daily. (Patient not taking: Reported on 12/11/2022) 84 tablet 1   No facility-administered medications prior to visit.    Allergies  Allergen Reactions   Hydrocodone Hives    Review of Systems  Constitutional:  Negative for fever.  HENT:  Negative for congestion, sinus pain and sore throat.   Respiratory:  Negative for cough, shortness of breath and wheezing.   Cardiovascular:  Negative for chest pain and palpitations.  Gastrointestinal:  Negative for abdominal pain, constipation, diarrhea, nausea and vomiting.  Genitourinary:  Negative for dysuria, frequency and hematuria.  Musculoskeletal:        (-)new muscle pain (-)new joint pain  Skin:        (-)New moles  Neurological:  Negative for headaches.       Objective:  Physical Exam Vitals and nursing note reviewed.  Constitutional:      General: She is not in acute distress.    Appearance: Normal appearance. She is not ill-appearing.  HENT:     Head: Normocephalic and atraumatic.     Right Ear: Tympanic membrane, ear canal and external ear normal.     Left Ear: Tympanic membrane, ear canal and external ear normal.  Eyes:     Extraocular Movements: Extraocular movements intact.     Pupils: Pupils are equal, round, and reactive to light.  Cardiovascular:     Rate and Rhythm: Normal rate and regular rhythm.     Heart sounds: Normal heart sounds. No murmur heard.    No gallop.  Pulmonary:     Effort: Pulmonary effort is normal. No respiratory  distress.     Breath sounds: Normal breath sounds. No wheezing or rales.  Abdominal:     General: Bowel sounds are normal. There is no distension.     Palpations: Abdomen is soft.     Tenderness: There is no abdominal tenderness. There is no guarding.  Skin:    General: Skin is warm and dry.  Neurological:     Mental Status: She is alert and oriented to person, place, and time.  Psychiatric:        Judgment: Judgment normal.     BP 98/68 (BP Location: Left Arm, Patient Position: Sitting, Cuff Size: Normal)   Pulse (!) 55   Temp 97.8 F (36.6 C) (Oral)   Resp 18   Ht 5\' 6"  (1.676 m)   Wt 149 lb 3.2 oz (67.7 kg)   LMP 12/05/2022   SpO2 98%   BMI 24.08 kg/m  Wt Readings from Last 3 Encounters:  12/11/22 149 lb 3.2 oz (67.7 kg)  11/08/21 147 lb 9.6 oz (67 kg)  04/12/20 140 lb 9.6 oz (63.8 kg)       Assessment & Plan:  Preventative health care Assessment & Plan: Ghm utd Check labs  See AVS   Orders: -     CBC with Differential/Platelet -     Comprehensive metabolic panel -     Lipid panel -     TSH    I, Ann Held, DO, personally preformed the services described in this documentation.  All medical record entries made by the scribe were at my direction and in my presence.  I have reviewed the chart and discharge instructions (if applicable) and agree that the record reflects my personal performance and is accurate and complete. 12/11/2022   I,Shehryar Baig,acting as a scribe for Ann Held, DO.,have documented all relevant documentation on the behalf of Ann Held, DO,as directed by  Ann Held, DO while in the presence of Ann Held, DO.   Ann Held, DO

## 2022-12-11 NOTE — Assessment & Plan Note (Signed)
Ghm utd Check labs  See AVS  

## 2022-12-11 NOTE — Patient Instructions (Signed)

## 2023-01-01 ENCOUNTER — Encounter: Payer: Self-pay | Admitting: Family Medicine
